# Patient Record
Sex: Female | Born: 1995 | Race: White | Hispanic: No | Marital: Single | Smoking: Never smoker
Health system: Southern US, Community
[De-identification: ages and names within clinical notes are randomized; demographics above are authoritative.]

## PROBLEM LIST (undated history)

## (undated) DIAGNOSIS — J069 Acute upper respiratory infection, unspecified: Secondary | ICD-10-CM

## (undated) HISTORY — DX: Acute upper respiratory infection, unspecified: J06.9

---
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
## 2013-11-11 ENCOUNTER — Other Ambulatory Visit: Payer: Self-pay | Admitting: Family Medicine

## 2013-11-11 ENCOUNTER — Ambulatory Visit
Admission: RE | Admit: 2013-11-11 | Discharge: 2013-11-11 | Disposition: A | Discharge status: Home / Self Care | Payer: PRIVATE HEALTH INSURANCE | Source: Ambulatory Visit | Attending: Family Medicine | Admitting: Family Medicine

## 2013-11-11 DIAGNOSIS — M546 Pain in thoracic spine: Secondary | ICD-10-CM

## 2016-07-25 ENCOUNTER — Other Ambulatory Visit: Payer: Self-pay | Admitting: Physical Medicine and Rehabilitation

## 2016-07-25 ENCOUNTER — Other Ambulatory Visit: Payer: Self-pay | Admitting: Sports Medicine

## 2016-07-25 DIAGNOSIS — M5412 Radiculopathy, cervical region: Secondary | ICD-10-CM

## 2016-07-30 ENCOUNTER — Ambulatory Visit
Admission: RE | Admit: 2016-07-30 | Discharge: 2016-07-30 | Disposition: A | Discharge status: Home / Self Care | Payer: PRIVATE HEALTH INSURANCE | Source: Ambulatory Visit | Attending: Sports Medicine | Admitting: Sports Medicine

## 2016-07-30 DIAGNOSIS — M5412 Radiculopathy, cervical region: Secondary | ICD-10-CM

## 2016-09-22 ENCOUNTER — Ambulatory Visit: Payer: PRIVATE HEALTH INSURANCE | Admitting: Neurology

## 2016-09-23 ENCOUNTER — Telehealth: Payer: Self-pay | Admitting: *Deleted

## 2016-09-23 NOTE — Telephone Encounter (Signed)
Called and spoke to pt. R/s appt from 09/22/16 to 10/11/16 at 8am, check in 730am. I placed pt on cx list as requested. Advised pt to bring insurance card, copay, and updated med list. She verbalized understanding.

## 2016-10-11 ENCOUNTER — Ambulatory Visit: Payer: Self-pay | Admitting: Neurology

## 2019-09-19 DIAGNOSIS — F909 Attention-deficit hyperactivity disorder, unspecified type: Secondary | ICD-10-CM | POA: Diagnosis not present

## 2019-11-22 DIAGNOSIS — R519 Headache, unspecified: Secondary | ICD-10-CM | POA: Diagnosis not present

## 2020-02-04 DIAGNOSIS — L700 Acne vulgaris: Secondary | ICD-10-CM | POA: Diagnosis not present

## 2020-02-25 DIAGNOSIS — H5213 Myopia, bilateral: Secondary | ICD-10-CM | POA: Diagnosis not present

## 2020-05-04 DIAGNOSIS — R300 Dysuria: Secondary | ICD-10-CM | POA: Diagnosis not present

## 2020-09-18 DIAGNOSIS — Z01419 Encounter for gynecological examination (general) (routine) without abnormal findings: Secondary | ICD-10-CM | POA: Diagnosis not present

## 2020-09-18 DIAGNOSIS — Z6824 Body mass index (BMI) 24.0-24.9, adult: Secondary | ICD-10-CM | POA: Diagnosis not present

## 2020-09-18 DIAGNOSIS — Z113 Encounter for screening for infections with a predominantly sexual mode of transmission: Secondary | ICD-10-CM | POA: Diagnosis not present

## 2020-10-06 DIAGNOSIS — F909 Attention-deficit hyperactivity disorder, unspecified type: Secondary | ICD-10-CM | POA: Diagnosis not present

## 2021-01-06 DIAGNOSIS — H6982 Other specified disorders of Eustachian tube, left ear: Secondary | ICD-10-CM | POA: Diagnosis not present

## 2021-03-09 DIAGNOSIS — J069 Acute upper respiratory infection, unspecified: Secondary | ICD-10-CM | POA: Diagnosis not present

## 2021-03-12 DIAGNOSIS — J019 Acute sinusitis, unspecified: Secondary | ICD-10-CM | POA: Diagnosis not present

## 2021-05-18 DIAGNOSIS — M79644 Pain in right finger(s): Secondary | ICD-10-CM | POA: Diagnosis not present

## 2021-09-21 DIAGNOSIS — Z6824 Body mass index (BMI) 24.0-24.9, adult: Secondary | ICD-10-CM | POA: Diagnosis not present

## 2021-09-21 DIAGNOSIS — Z113 Encounter for screening for infections with a predominantly sexual mode of transmission: Secondary | ICD-10-CM | POA: Diagnosis not present

## 2021-09-21 DIAGNOSIS — Z01419 Encounter for gynecological examination (general) (routine) without abnormal findings: Secondary | ICD-10-CM | POA: Diagnosis not present

## 2021-09-21 DIAGNOSIS — F909 Attention-deficit hyperactivity disorder, unspecified type: Secondary | ICD-10-CM | POA: Insufficient documentation

## 2021-10-12 DIAGNOSIS — Z131 Encounter for screening for diabetes mellitus: Secondary | ICD-10-CM | POA: Diagnosis not present

## 2021-10-12 DIAGNOSIS — F909 Attention-deficit hyperactivity disorder, unspecified type: Secondary | ICD-10-CM | POA: Diagnosis not present

## 2021-10-12 DIAGNOSIS — Z13220 Encounter for screening for lipoid disorders: Secondary | ICD-10-CM | POA: Diagnosis not present

## 2022-07-25 DIAGNOSIS — H5213 Myopia, bilateral: Secondary | ICD-10-CM | POA: Diagnosis not present

## 2022-09-22 DIAGNOSIS — Z1151 Encounter for screening for human papillomavirus (HPV): Secondary | ICD-10-CM | POA: Diagnosis not present

## 2022-09-22 DIAGNOSIS — Z6823 Body mass index (BMI) 23.0-23.9, adult: Secondary | ICD-10-CM | POA: Diagnosis not present

## 2022-09-22 DIAGNOSIS — Z124 Encounter for screening for malignant neoplasm of cervix: Secondary | ICD-10-CM | POA: Diagnosis not present

## 2022-09-22 DIAGNOSIS — Z01419 Encounter for gynecological examination (general) (routine) without abnormal findings: Secondary | ICD-10-CM | POA: Diagnosis not present

## 2022-10-12 DIAGNOSIS — F909 Attention-deficit hyperactivity disorder, unspecified type: Secondary | ICD-10-CM | POA: Diagnosis not present

## 2022-10-12 DIAGNOSIS — M7652 Patellar tendinitis, left knee: Secondary | ICD-10-CM | POA: Diagnosis not present

## 2022-11-04 ENCOUNTER — Other Ambulatory Visit (HOSPITAL_COMMUNITY): Payer: Self-pay

## 2022-11-04 MED ORDER — AMPHETAMINE-DEXTROAMPHETAMINE 10 MG PO TABS
20.0000 mg | ORAL_TABLET | Freq: Two times a day (BID) | ORAL | 0 refills | Status: DC
  Filled 2022-11-04 – 2022-11-07 (×2): qty 120, 30d supply, fill #0

## 2022-11-07 ENCOUNTER — Other Ambulatory Visit (HOSPITAL_COMMUNITY): Payer: Self-pay

## 2022-12-05 ENCOUNTER — Other Ambulatory Visit (HOSPITAL_COMMUNITY): Payer: Self-pay

## 2022-12-05 MED ORDER — AMPHETAMINE-DEXTROAMPHETAMINE 10 MG PO TABS
20.0000 mg | ORAL_TABLET | Freq: Two times a day (BID) | ORAL | 0 refills | Status: DC
  Filled 2022-12-05 (×2): qty 120, 30d supply, fill #0

## 2023-01-04 ENCOUNTER — Other Ambulatory Visit (HOSPITAL_COMMUNITY): Payer: Self-pay

## 2023-01-04 MED ORDER — AMPHETAMINE-DEXTROAMPHETAMINE 10 MG PO TABS
20.0000 mg | ORAL_TABLET | Freq: Two times a day (BID) | ORAL | 0 refills | Status: DC
  Filled 2023-01-04: qty 120, 30d supply, fill #0

## 2023-02-03 ENCOUNTER — Other Ambulatory Visit (HOSPITAL_COMMUNITY): Payer: Self-pay

## 2023-02-03 MED ORDER — AMPHETAMINE-DEXTROAMPHETAMINE 10 MG PO TABS
20.0000 mg | ORAL_TABLET | Freq: Two times a day (BID) | ORAL | 0 refills | Status: DC
  Filled 2023-02-03: qty 120, 30d supply, fill #0

## 2023-03-06 ENCOUNTER — Other Ambulatory Visit (HOSPITAL_COMMUNITY): Payer: Self-pay

## 2023-03-06 MED ORDER — AMPHETAMINE-DEXTROAMPHETAMINE 10 MG PO TABS
20.0000 mg | ORAL_TABLET | Freq: Two times a day (BID) | ORAL | 0 refills | Status: DC
  Filled 2023-03-06: qty 120, 30d supply, fill #0

## 2023-04-04 ENCOUNTER — Other Ambulatory Visit (HOSPITAL_COMMUNITY): Payer: Self-pay

## 2023-04-04 MED ORDER — AMPHETAMINE-DEXTROAMPHETAMINE 10 MG PO TABS
20.0000 mg | ORAL_TABLET | Freq: Two times a day (BID) | ORAL | 0 refills | Status: DC
  Filled 2023-04-04: qty 120, 30d supply, fill #0

## 2023-05-02 ENCOUNTER — Other Ambulatory Visit: Payer: Self-pay

## 2023-05-02 ENCOUNTER — Other Ambulatory Visit (HOSPITAL_COMMUNITY): Payer: Self-pay

## 2023-05-02 MED ORDER — AMPHETAMINE-DEXTROAMPHETAMINE 10 MG PO TABS
20.0000 mg | ORAL_TABLET | Freq: Two times a day (BID) | ORAL | 0 refills | Status: DC
  Filled 2023-05-02: qty 120, 30d supply, fill #0

## 2023-05-29 ENCOUNTER — Other Ambulatory Visit (HOSPITAL_COMMUNITY): Payer: Self-pay

## 2023-05-29 MED ORDER — AMPHETAMINE-DEXTROAMPHETAMINE 10 MG PO TABS
20.0000 mg | ORAL_TABLET | Freq: Two times a day (BID) | ORAL | 0 refills | Status: DC
  Filled 2023-05-29: qty 120, 30d supply, fill #0

## 2023-05-30 ENCOUNTER — Other Ambulatory Visit (HOSPITAL_COMMUNITY): Payer: Self-pay

## 2023-06-29 ENCOUNTER — Other Ambulatory Visit (HOSPITAL_COMMUNITY): Payer: Self-pay

## 2023-06-29 MED ORDER — AMPHETAMINE-DEXTROAMPHETAMINE 10 MG PO TABS
20.0000 mg | ORAL_TABLET | Freq: Two times a day (BID) | ORAL | 0 refills | Status: AC
  Filled 2023-06-29: qty 60, 15d supply, fill #0

## 2023-07-24 ENCOUNTER — Other Ambulatory Visit (HOSPITAL_COMMUNITY): Payer: Self-pay

## 2023-07-24 DIAGNOSIS — F909 Attention-deficit hyperactivity disorder, unspecified type: Secondary | ICD-10-CM | POA: Diagnosis not present

## 2023-07-24 DIAGNOSIS — M722 Plantar fascial fibromatosis: Secondary | ICD-10-CM | POA: Diagnosis not present

## 2023-07-24 MED ORDER — AMPHETAMINE-DEXTROAMPHETAMINE 10 MG PO TABS
20.0000 mg | ORAL_TABLET | Freq: Two times a day (BID) | ORAL | 0 refills | Status: AC
  Filled 2023-08-21: qty 120, 30d supply, fill #0

## 2023-07-24 MED ORDER — AMPHETAMINE-DEXTROAMPHETAMINE 10 MG PO TABS
20.0000 mg | ORAL_TABLET | Freq: Two times a day (BID) | ORAL | 0 refills | Status: AC
  Filled 2023-07-24: qty 120, 30d supply, fill #0

## 2023-07-24 MED ORDER — AMPHETAMINE-DEXTROAMPHETAMINE 10 MG PO TABS
20.0000 mg | ORAL_TABLET | Freq: Two times a day (BID) | ORAL | 0 refills | Status: DC
  Filled 2023-09-19: qty 90, 30d supply, fill #0
  Filled 2023-09-19: qty 30, 8d supply, fill #1

## 2023-08-11 ENCOUNTER — Other Ambulatory Visit (HOSPITAL_COMMUNITY): Payer: Self-pay

## 2023-08-11 DIAGNOSIS — J988 Other specified respiratory disorders: Secondary | ICD-10-CM | POA: Diagnosis not present

## 2023-08-11 MED ORDER — IPRATROPIUM BROMIDE 0.03 % NA SOLN
2.0000 | Freq: Two times a day (BID) | NASAL | 1 refills | Status: DC
  Filled 2023-08-11: qty 30, 43d supply, fill #0

## 2023-08-11 MED ORDER — DOXYCYCLINE MONOHYDRATE 100 MG PO CAPS
100.0000 mg | ORAL_CAPSULE | Freq: Two times a day (BID) | ORAL | 0 refills | Status: DC
  Filled 2023-08-11 (×2): qty 20, 10d supply, fill #0

## 2023-08-21 ENCOUNTER — Other Ambulatory Visit (HOSPITAL_COMMUNITY): Payer: Self-pay

## 2023-08-21 ENCOUNTER — Other Ambulatory Visit: Payer: Self-pay

## 2023-09-19 ENCOUNTER — Other Ambulatory Visit (HOSPITAL_COMMUNITY): Payer: Self-pay

## 2023-09-20 ENCOUNTER — Other Ambulatory Visit (HOSPITAL_COMMUNITY): Payer: Self-pay

## 2023-10-11 ENCOUNTER — Other Ambulatory Visit (HOSPITAL_COMMUNITY): Payer: Self-pay

## 2023-10-11 MED ORDER — AMPHETAMINE-DEXTROAMPHETAMINE 10 MG PO TABS
20.0000 mg | ORAL_TABLET | Freq: Two times a day (BID) | ORAL | 0 refills | Status: AC
  Filled 2023-11-14: qty 120, 30d supply, fill #0

## 2023-10-11 MED ORDER — AMPHETAMINE-DEXTROAMPHETAMINE 10 MG PO TABS
20.0000 mg | ORAL_TABLET | Freq: Two times a day (BID) | ORAL | 0 refills | Status: AC
  Filled 2023-12-14: qty 120, 30d supply, fill #0

## 2023-10-11 MED ORDER — AMPHETAMINE-DEXTROAMPHETAMINE 10 MG PO TABS
20.0000 mg | ORAL_TABLET | Freq: Two times a day (BID) | ORAL | 0 refills | Status: AC
  Filled 2023-10-11: qty 120, 60d supply, fill #0
  Filled 2023-10-17: qty 120, 30d supply, fill #0

## 2023-10-17 ENCOUNTER — Other Ambulatory Visit (HOSPITAL_COMMUNITY): Payer: Self-pay

## 2023-11-14 ENCOUNTER — Other Ambulatory Visit (HOSPITAL_COMMUNITY): Payer: Self-pay

## 2023-12-13 ENCOUNTER — Other Ambulatory Visit (HOSPITAL_COMMUNITY): Payer: Self-pay

## 2023-12-14 ENCOUNTER — Other Ambulatory Visit (HOSPITAL_COMMUNITY): Payer: Self-pay

## 2023-12-27 ENCOUNTER — Ambulatory Visit (INDEPENDENT_AMBULATORY_CARE_PROVIDER_SITE_OTHER)

## 2023-12-27 ENCOUNTER — Ambulatory Visit: Admitting: Podiatry

## 2023-12-27 DIAGNOSIS — M21612 Bunion of left foot: Secondary | ICD-10-CM | POA: Diagnosis not present

## 2023-12-27 DIAGNOSIS — M7752 Other enthesopathy of left foot: Secondary | ICD-10-CM

## 2023-12-27 NOTE — Progress Notes (Signed)
  Subjective:  Patient ID: Joann Case, female    DOB: 06/22/1996,   MRN: [id number]  No chief complaint on file.   28 y.o. female presents for concern of left foot bunion that has been present for a while but has started to consistently bother her for the last year and a half. Relates she is pretty active and states the toe is starting to affect her. Relates discomfort around bunion but also relates numbness and balance issues that are related as well. Has tried different shoes but still gets pain  . Denies any other pedal complaints. Denies n/v/f/c.   No past medical history on file.  Objective:  Physical Exam: Vascular: DP/PT pulses 2/4 bilateral. CFT <3 seconds. Normal hair growth on digits. No edema.  Skin. No lacerations or abrasions bilateral feet.  Musculoskeletal: MMT 5/5 bilateral lower extremities in DF, PF, Inversion and Eversion. Deceased ROM in DF of ankle joint.  Neurological: Sensation intact to light touch.   Assessment:   1. Bunion, left foot      Plan:  Patient was evaluated and treated and all questions answered. -Xrays reviewed. 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 Noted elongated on medial border of proximal phalanx and more curvation on lateral side of proximal phalanx.  -Discussed HAV and treatment options;conservative and surgical management; risks, benefits, alternatives discussed. All patient's questions answered. -Discussed padding and wide shoe gear.   -Recommend continue with good supportive shoes and inserts.  -Discussed surgical options. Discussed austin bunionectomy and aiken osteotomy and perioperative course in detail with patient.  Patient is getting married in October and would like to consider surgery after this to help. Did breifly discuss risk and discussed that he numbness and balance issue could be related to pain in her back and may not be resolved with surgery. She expressed  understanding.  -Patient to return to office as needed or sooner if condition worsens.   Jennefer Moats, DPM

## 2024-01-04 ENCOUNTER — Other Ambulatory Visit (HOSPITAL_COMMUNITY): Payer: Self-pay

## 2024-01-04 MED ORDER — AMPHETAMINE-DEXTROAMPHETAMINE 10 MG PO TABS
20.0000 mg | ORAL_TABLET | Freq: Two times a day (BID) | ORAL | 0 refills | Status: DC
  Filled 2024-03-07: qty 120, 30d supply, fill #0

## 2024-01-04 MED ORDER — AMPHETAMINE-DEXTROAMPHETAMINE 10 MG PO TABS
20.0000 mg | ORAL_TABLET | Freq: Two times a day (BID) | ORAL | 0 refills | Status: AC
  Filled 2024-01-11: qty 120, 30d supply, fill #0

## 2024-01-04 MED ORDER — AMPHETAMINE-DEXTROAMPHETAMINE 10 MG PO TABS
20.0000 mg | ORAL_TABLET | Freq: Two times a day (BID) | ORAL | 0 refills | Status: AC
  Filled 2024-02-08: qty 120, 30d supply, fill #0

## 2024-01-11 ENCOUNTER — Other Ambulatory Visit (HOSPITAL_COMMUNITY): Payer: Self-pay

## 2024-02-08 ENCOUNTER — Other Ambulatory Visit (HOSPITAL_COMMUNITY): Payer: Self-pay

## 2024-02-09 ENCOUNTER — Other Ambulatory Visit (HOSPITAL_COMMUNITY): Payer: Self-pay

## 2024-02-12 NOTE — Progress Notes (Unsigned)
 New Patient Note  RE: Joann Case MRN: [id number] DOB: [birth year] Date of Office Visit: 02/13/2024  Consult requested by: Elida Grounds, DO Primary care provider: No primary care provider on file.  Chief Complaint: No chief complaint on file.  History of Present Illness: I had the pleasure of seeing Joann Case for initial evaluation at the [facility] on 02/12/2024. She is a 28 y.o. female, who is referred here by No primary care provider on file. for the evaluation of ***.  Discussed the use of AI scribe software for clinical note transcription with the patient, who gave verbal consent to proceed.  History of Present Illness             ***  Assessment and Plan: Joann Case is a 28 y.o. female with: ***  Assessment and Plan               No follow-ups on file.  No orders of the defined types were placed in this encounter.  Lab Orders  No laboratory test(s) ordered today    Other allergy screening: Asthma: {Blank single:19197::"yes","no"} Rhino conjunctivitis: {Blank single:19197::"yes","no"} Food allergy: {Blank single:19197::"yes","no"} Medication allergy: {Blank single:19197::"yes","no"} Hymenoptera allergy: {Blank single:19197::"yes","no"} Urticaria: {Blank single:19197::"yes","no"} Eczema:{Blank single:19197::"yes","no"} History of recurrent infections suggestive of immunodeficency: {Blank single:19197::"yes","no"}  Diagnostics: Spirometry:  Tracings reviewed. Her effort: {Blank single:19197::"Good reproducible efforts.","It was hard to get consistent efforts and there is a question as to whether this reflects a maximal maneuver.","Poor effort, data can not be interpreted."} FVC: ***L FEV1: ***L, ***% predicted FEV1/FVC ratio: ***% Interpretation: {Blank single:19197::"Spirometry consistent with mild obstructive disease","Spirometry consistent with moderate obstructive disease","Spirometry consistent with severe obstructive  disease","Spirometry consistent with possible restrictive disease","Spirometry consistent with mixed obstructive and restrictive disease","Spirometry uninterpretable due to technique","Spirometry consistent with normal pattern","No overt abnormalities noted given today's efforts"}.  Please see scanned spirometry results for details.  Skin Testing: {Blank single:19197::"Select foods","Environmental allergy panel","Environmental allergy panel and select foods","Food allergy panel","None","Deferred due to recent antihistamines use"}. *** Results discussed with patient/family.   Past Medical History: There are no active problems to display for this patient.  No past medical history on file. Past Surgical History: *** The histories are not reviewed yet. Please review them in the "History" navigator section and refresh this SmartLink. Medication List:  Current Outpatient Medications  Medication Sig Dispense Refill  . amphetamine -dextroamphetamine  (ADDERALL) 10 MG tablet Take 2 tablets (20 mg total) by mouth 2 (two) times daily. 60 tablet 0  . amphetamine -dextroamphetamine  (ADDERALL) 10 MG tablet Take 2 tablets (20 mg total) by mouth 2 (two) times daily. (08/21/23) 120 tablet 0  . amphetamine -dextroamphetamine  (ADDERALL) 10 MG tablet Take 2 tablets (20 mg total) by mouth 2 (two) times daily. 120 tablet 0  . amphetamine -dextroamphetamine  (ADDERALL) 10 MG tablet Take 2 tablets (20 mg total) by mouth 2 (two) times daily. 120 tablet 0  . amphetamine -dextroamphetamine  (ADDERALL) 10 MG tablet Take 2 tablets (20 mg total) by mouth 2 (two) times daily. (12/05/23) 120 tablet 0  . amphetamine -dextroamphetamine  (ADDERALL) 10 MG tablet Take 2 tablets (20 mg total) by mouth 2 (two) times daily. (11/06/23) 120 tablet 0  . amphetamine -dextroamphetamine  (ADDERALL) 10 MG tablet Take 2 tablets (20 mg total) by mouth 2 (two) times daily. 120 tablet 0  . [START ON 03/07/2024] amphetamine -dextroamphetamine  (ADDERALL) 10 MG  tablet Take 2 tablets (20 mg total) by mouth 2 (two) times daily. 120 tablet 0  . amphetamine -dextroamphetamine  (ADDERALL) 10 MG tablet Take 2 tablets (20 mg total) by mouth  2 (two) times daily. 120 tablet 0  . doxycycline  (MONODOX ) 100 MG capsule Take 1 capsule (100 mg total) by mouth 2 (two) times daily for 10 days. 20 capsule 0  . ipratropium (ATROVENT ) 0.03 % nasal spray Place 2 sprays into each nostril 2 (two) times daily. 30 mL 1   No current facility-administered medications for this visit.   Allergies: No Known Allergies Social History: Social History   Socioeconomic History  . Marital status: Single    Spouse name: Not on file  . Number of children: Not on file  . Years of education: Not on file  . Highest education level: Not on file  Occupational History  . Not on file  Tobacco Use  . Smoking status: Not on file  . Smokeless tobacco: Not on file  Substance and Sexual Activity  . Alcohol use: Not on file  . Drug use: Not on file  . Sexual activity: Not on file  Other Topics Concern  . Not on file  Social History Narrative  . Not on file   Social Drivers of Health   Financial Resource Strain: Not on file  Food Insecurity: Not on file  Transportation Needs: Not on file  Physical Activity: Not on file  Stress: Not on file  Social Connections: Not on file   Lives in a ***. Smoking: *** Occupation: ***  Environmental HistorySurveyor, minerals in the house: Copywriter, advertising in the family room: {Blank single:19197::"yes","no"} Carpet in the bedroom: {Blank single:19197::"yes","no"} Heating: {Blank single:19197::"electric","gas","heat pump"} Cooling: {Blank single:19197::"central","window","heat pump"} Pet: {Blank single:19197::"yes ***","no"}  Family History: No family history on file. Problem                               Relation Asthma                                   *** Eczema                                *** Food allergy                           *** Allergic rhino conjunctivitis     ***  Review of Systems  Constitutional:  Negative for appetite change, chills, fever and unexpected weight change.  HENT:  Negative for congestion and rhinorrhea.   Eyes:  Negative for itching.  Respiratory:  Negative for cough, chest tightness, shortness of breath and wheezing.   Cardiovascular:  Negative for chest pain.  Gastrointestinal:  Negative for abdominal pain.  Genitourinary:  Negative for difficulty urinating.  Skin:  Negative for rash.  Neurological:  Negative for headaches.   Objective: There were no vitals taken for this visit. There is no height or weight on file to calculate BMI. Physical Exam Vitals and nursing note reviewed.  Constitutional:      Appearance: Normal appearance. She is well-developed.  HENT:     Head: Normocephalic and atraumatic.     Right Ear: Tympanic membrane and external ear normal.     Left Ear: Tympanic membrane and external ear normal.     Nose: Nose normal.     Mouth/Throat:     Mouth: Mucous membranes are moist.     Pharynx: Oropharynx is clear.  Eyes:     Conjunctiva/sclera: Conjunctivae normal.  Cardiovascular:     Rate and Rhythm: Normal rate and regular rhythm.     Heart sounds: Normal heart sounds. No murmur heard.    No friction rub. No gallop.  Pulmonary:     Effort: Pulmonary effort is normal.     Breath sounds: Normal breath sounds. No wheezing, rhonchi or rales.  Musculoskeletal:     Cervical back: Neck supple.  Skin:    General: Skin is warm.     Findings: No rash.  Neurological:     Mental Status: She is alert and oriented to person, place, and time.  Psychiatric:        Behavior: Behavior normal.  The plan was reviewed with the patient/family, and all questions/concerned were addressed.  It was my pleasure to see Joann Case today and participate in her care. Please feel free to contact me with any questions or concerns.  Sincerely,  Eudelia Hero,  DO Allergy & Immunology  [facility] office: [phone] [facility] office: [phone]

## 2024-02-13 ENCOUNTER — Encounter: Payer: Self-pay | Admitting: Allergy

## 2024-02-13 ENCOUNTER — Other Ambulatory Visit: Payer: Self-pay

## 2024-02-13 ENCOUNTER — Ambulatory Visit: Admitting: Allergy

## 2024-02-13 VITALS — BP 118/70 | HR 77 | Temp 98.6°F | Resp 18 | Ht 63.39 in | Wt 137.8 lb

## 2024-02-13 DIAGNOSIS — L2389 Allergic contact dermatitis due to other agents: Secondary | ICD-10-CM

## 2024-02-13 DIAGNOSIS — J3089 Other allergic rhinitis: Secondary | ICD-10-CM | POA: Diagnosis not present

## 2024-02-13 DIAGNOSIS — R12 Heartburn: Secondary | ICD-10-CM

## 2024-02-13 DIAGNOSIS — H1013 Acute atopic conjunctivitis, bilateral: Secondary | ICD-10-CM

## 2024-02-13 DIAGNOSIS — Z8709 Personal history of other diseases of the respiratory system: Secondary | ICD-10-CM

## 2024-02-13 NOTE — Patient Instructions (Addendum)
 Rhinitis  Return for allergy skin testing. Will make additional recommendations based on results. If significant positives will recommend allergy injections - handout given. Make sure you don't take any antihistamines for 3 days before the skin testing appointment. Don't put any lotion on the back and arms on the day of testing.  Plan on being here for 60 minutes.   Use over the counter antihistamines such as Zyrtec (cetirizine), Claritin (loratadine), Allegra (fexofenadine), or Xyzal (levocetirizine) daily as needed. May take twice a day during allergy flares. May switch antihistamines every few months.   Bee stings I usually order bloodwork for this but with no history of stings/reactions it will most likely be negative. See handout.  Heartburn See handout for lifestyle and dietary modifications. May take famotidine (pepcid) once a day as before.  Hold 1 day before skin testing.  Rash Keep track of rashes and take pictures. See below for proper skin care. Use fragrance free and dye free products. No dryer sheets or fabric softener.   If you develop other contact rash issues then will recommend patch testing next.   Follow up for skin testing.

## 2024-03-04 NOTE — Progress Notes (Unsigned)
 Skin testing note  RE: Joann Case MRN: [id number] DOB: 12-25-1995 Date of Office Visit: 03/05/2024  Referring provider: No ref. provider found Primary care provider: Auston Opal, DO  Chief Complaint: skin testing  History of Present Illness: I had the pleasure of seeing Joann Case for a skin testing visit at the [facility] on 03/05/2024. She is a 28 y.o. female, who is being followed for allergic rhinoconjunctivitis, contact dermatitis, history of asthma and heartburn. Her previous allergy office visit was on 02/13/2024 with Dr. Luke. Today is a skin testing visit.   Discussed the use of AI scribe software for clinical note transcription with the patient, who gave verbal consent to proceed.  Assessment and Plan: Joann Case is a 28 y.o. female with: Other allergic rhinitis Allergic conjunctivitis of both eyes Past history - chronic environmental allergies with symptoms exacerbated in spring. Claritin provides partial relief. Today's skin testing positive to ragweed, trees, mold, dog.  Start environmental control measures as below. Use over the counter antihistamines such as Zyrtec (cetirizine), Claritin (loratadine), Allegra (fexofenadine), or Xyzal (levocetirizine) daily as needed. May take twice a day during allergy flares. May switch antihistamines every few months. Use Flonase (fluticasone) nasal spray 1-2 sprays per nostril once a day as needed for nasal congestion.  Nasal saline spray (i.e., Simply Saline) or nasal saline lavage (i.e., NeilMed) is recommended as needed and prior to medicated nasal sprays. Use olopatadine eye drops 0.7% once a day as needed for itchy/watery eyes. Sample given.  Take contacts out and wait 5-10 min after using eye drops to put contacts back in.  Recommend allergy injections. 2 injections. Let us  know when ready to start.  Had a detailed discussion with patient/family that clinical history is suggestive of allergic rhinitis,  and may benefit from allergy immunotherapy (AIT). Discussed in detail regarding the dosing, schedule, side effects (mild to moderate local allergic reaction and rarely systemic allergic reactions including anaphylaxis), and benefits (significant improvement in nasal symptoms, seasonal flares of asthma) of immunotherapy with the patient. There is significant time commitment involved with allergy shots, which includes weekly immunotherapy injections for first 9-12 months and then biweekly to monthly injections for 3-5 years.   Allergic contact dermatitis due to other agents Past history - perioral dermatitis triggered by certain sunscreens. Currently using Cerave sunscreen without issues. Keep track of rashes and take pictures. See below for proper skin care. Use fragrance free and dye free products. No dryer sheets or fabric softener.   If you develop other contact rash issues then will recommend patch testing next.      Heartburn Continue lifestyle and dietary modifications. May take famotidine (pepcid) once a day as before.   Return in about 1 year (around 03/05/2025).  No orders of the defined types were placed in this encounter.  Lab Orders  No laboratory test(s) ordered today    Diagnostics: Skin Testing: Environmental allergy panel. Today's skin testing positive to ragweed, trees, mold, dog.  Results discussed with patient/family.  Airborne Adult Perc - 03/05/24 1354     Time Antigen Placed 0200    Allergen Manufacturer Jestine    Location Back    Number of Test 55    1. Control-Buffer 50% Glycerol Negative    2. Control-Histamine 3+    3. Bahia Negative    4. French Southern Territories Negative    5. Johnson Negative    6. [place]  Blue Negative    7. Meadow Fescue Negative    8.  Perennial Rye Negative    9. Timothy Negative    10. Ragweed Mix Negative    11. Cocklebur Negative    12. Plantain,  English Negative    13. Baccharis Negative    14. Dog Fennel Negative    15. Russian Thistle  Negative    16. Lamb's Quarters Negative    17. Sheep Sorrell Negative    18. Rough Pigweed Negative    19. Marsh Elder, Rough Negative    20. Mugwort, Common Negative    21. Box, Elder 2+    22. Cedar, red Negative    23. Sweet Gum Negative    24. Pecan Pollen Negative    25. Pine Mix Negative    26. Walnut, Black Pollen Negative    27. Red Mulberry Negative    28. Ash Mix Negative    29. Birch Mix Negative    30. Beech American Negative    31. Cottonwood, Guinea-Bissau Negative    32. Hickory, White Negative    33. Maple Mix 2+    34. Oak, Guinea-Bissau Mix Negative    35. Sycamore Eastern Negative    36. Alternaria Alternata 2+    37. Cladosporium Herbarum Negative    38. Aspergillus Mix 2+    39. Penicillium Mix Negative    40. Bipolaris Sorokiniana (Helminthosporium) --   +/-   41. Drechslera Spicifera (Curvularia) 2+    42. Mucor Plumbeus Negative    43. Fusarium Moniliforme 2+    44. Aureobasidium Pullulans (pullulara) Negative    45. Rhizopus Oryzae Negative    46. Botrytis Cinera Negative    47. Epicoccum Nigrum Negative    48. Phoma Betae 3+    49. Dust Mite Mix Negative    50. Cat Hair 10,000 BAU/ml Negative    51.  Dog Epithelia Negative    52. Mixed Feathers Negative    53. Horse Epithelia Negative    54. Cockroach, German Negative    55. Tobacco Leaf Negative          Intradermal - 03/05/24 1442     Time Antigen Placed 0240    Number of Test 11    Control 3+    Bahia Negative    French Southern Territories Negative    Johnson Negative    7 Grass Negative    Ragweed Mix 2+    Weed Mix Negative    Mite Mix Negative    Cat Negative    Dog 2+    Cockroach Negative          Previous notes and tests were reviewed. The plan was reviewed with the patient/family, and all questions/concerned were addressed.  It was my pleasure to see Joann Case today and participate in her care. Please feel free to contact me with any questions or concerns.  Sincerely,  Orlan Cramp, DO Allergy  & Immunology  [facility]  [facility] office: [phone] [facility] office: [phone]

## 2024-03-05 ENCOUNTER — Encounter: Payer: Self-pay | Admitting: Allergy

## 2024-03-05 ENCOUNTER — Ambulatory Visit: Admitting: Allergy

## 2024-03-05 DIAGNOSIS — J3089 Other allergic rhinitis: Secondary | ICD-10-CM | POA: Diagnosis not present

## 2024-03-05 DIAGNOSIS — L2389 Allergic contact dermatitis due to other agents: Secondary | ICD-10-CM

## 2024-03-05 DIAGNOSIS — H1013 Acute atopic conjunctivitis, bilateral: Secondary | ICD-10-CM

## 2024-03-05 DIAGNOSIS — Z8709 Personal history of other diseases of the respiratory system: Secondary | ICD-10-CM

## 2024-03-05 DIAGNOSIS — R12 Heartburn: Secondary | ICD-10-CM

## 2024-03-05 NOTE — Patient Instructions (Addendum)
 Today's skin testing positive to ragweed, trees, mold, dog.   Results given.  Environmental allergies Start environmental control measures as below. Use over the counter antihistamines such as Zyrtec (cetirizine), Claritin (loratadine), Allegra (fexofenadine), or Xyzal (levocetirizine) daily as needed. May take twice a day during allergy flares. May switch antihistamines every few months. Use Flonase (fluticasone) nasal spray 1-2 sprays per nostril once a day as needed for nasal congestion.  Nasal saline spray (i.e., Simply Saline) or nasal saline lavage (i.e., NeilMed) is recommended as needed and prior to medicated nasal sprays. Use olopatadine eye drops 0.7% once a day as needed for itchy/watery eyes. Sample given.  Take contacts out and wait 5-10 min after using eye drops to put contacts back in.  Recommend allergy injections. 2 injections. Let us  know when ready to start.  Had a detailed discussion with patient/family that clinical history is suggestive of allergic rhinitis, and may benefit from allergy immunotherapy (AIT). Discussed in detail regarding the dosing, schedule, side effects (mild to moderate local allergic reaction and rarely systemic allergic reactions including anaphylaxis), and benefits (significant improvement in nasal symptoms, seasonal flares of asthma) of immunotherapy with the patient. There is significant time commitment involved with allergy shots, which includes weekly immunotherapy injections for first 9-12 months and then biweekly to monthly injections for 3-5 years.  Heartburn Continue lifestyle and dietary modifications. May take famotidine (pepcid) once a day as before.   Rash Keep track of rashes and take pictures. See below for proper skin care. Use fragrance free and dye free products. No dryer sheets or fabric softener.   If you develop other contact rash issues then will recommend patch testing next.   Return in about 1 year (around 03/05/2025). Or  sooner if needed.   Reducing Pollen Exposure Pollen seasons: trees (spring), grass (summer) and ragweed/weeds (fall). Keep windows closed in your home and car to lower pollen exposure.  Install air conditioning in the bedroom and throughout the house if possible.  Avoid going out in dry windy days - especially early morning. Pollen counts are highest between 5 - 10 AM and on dry, hot and windy days.  Save outside activities for late afternoon or after a heavy rain, when pollen levels are lower.  Avoid mowing of grass if you have grass pollen allergy. Be aware that pollen can also be transported indoors on people and pets.  Dry your clothes in an automatic dryer rather than hanging them outside where they might collect pollen.  Rinse hair and eyes before bedtime.  Mold Control Mold and fungi can grow on a variety of surfaces provided certain temperature and moisture conditions exist.  Outdoor molds grow on plants, decaying vegetation and soil. The major outdoor mold, Alternaria and Cladosporium, are found in very high numbers during hot and dry conditions. Generally, a late summer - fall peak is seen for common outdoor fungal spores. Rain will temporarily lower outdoor mold spore count, but counts rise rapidly when the rainy period ends. The most important indoor molds are Aspergillus and Penicillium. Dark, humid and poorly ventilated basements are ideal sites for mold growth. The next most common sites of mold growth are the bathroom and the kitchen. Outdoor (Seasonal) Mold Control Use air conditioning and keep windows closed. Avoid exposure to decaying vegetation. Avoid leaf raking. Avoid grain handling. Consider wearing a face mask if working in moldy areas.  Indoor (Perennial) Mold Control  Maintain humidity below 50%. Get rid of mold growth on hard surfaces with water,  detergent and, if necessary, 5% bleach (do not mix with other cleaners). Then dry the area completely. If mold covers  an area more than 10 square feet, consider hiring an indoor environmental professional. For clothing, washing with soap and water is best. If moldy items cannot be cleaned and dried, throw them away. Remove sources e.g. contaminated carpets. Repair and seal leaking roofs or pipes. Using dehumidifiers in damp basements may be helpful, but empty the water and clean units regularly to prevent mildew from forming. All rooms, especially basements, bathrooms and kitchens, require ventilation and cleaning to deter mold and mildew growth. Avoid carpeting on concrete or damp floors, and storing items in damp areas.  Pet Allergen Avoidance: Contrary to popular opinion, there are no "hypoallergenic" breeds of dogs or cats. That is because people are not allergic to an animal's hair, but to an allergen found in the animal's saliva, dander (dead skin flakes) or urine. Pet allergy symptoms typically occur within minutes. For some people, symptoms can build up and become most severe 8 to 12 hours after contact with the animal. People with severe allergies can experience reactions in public places if dander has been transported on the pet owners' clothing. Keeping an animal outdoors is only a partial solution, since homes with pets in the yard still have higher concentrations of animal allergens. Before getting a pet, ask your allergist to determine if you are allergic to animals. If your pet is already considered part of your family, try to minimize contact and keep the pet out of the bedroom and other rooms where you spend a great deal of time. As with dust mites, vacuum carpets often or replace carpet with a hardwood floor, tile or linoleum. High-efficiency particulate air (HEPA) cleaners can reduce allergen levels over time. While dander and saliva are the source of cat and dog allergens, urine is the source of allergens from rabbits, hamsters, mice and [place] pigs; so ask a non-allergic family member to clean the  animal's cage. If you have a pet allergy, talk to your allergist about the potential for allergy immunotherapy (allergy shots). This strategy can often provide long-term relief.  Skin care recommendations  Bath time: Always use lukewarm water. AVOID very hot or cold water. Keep bathing time to 5-10 minutes. Do NOT use bubble bath. Use a mild soap and use just enough to wash the dirty areas. Do NOT scrub skin vigorously.  After bathing, pat dry your skin with a towel. Do NOT rub or scrub the skin.  Moisturizers and prescriptions:  ALWAYS apply moisturizers immediately after bathing (within 3 minutes). This helps to lock-in moisture. Use the moisturizer several times a day over the whole body. Good summer moisturizers include: Aveeno, CeraVe, Cetaphil. Good winter moisturizers include: Aquaphor, Vaseline, Cerave, Cetaphil, Eucerin, Vanicream. When using moisturizers along with medications, the moisturizer should be applied about one hour after applying the medication to prevent diluting effect of the medication or moisturize around where you applied the medications. When not using medications, the moisturizer can be continued twice daily as maintenance.  Laundry and clothing: Avoid laundry products with added color or perfumes. Use unscented hypo-allergenic laundry products such as Tide free, Cheer free & gentle, and All free and clear.  If the skin still seems dry or sensitive, you can try double-rinsing the clothes. Avoid tight or scratchy clothing such as wool. Do not use fabric softeners or dyer sheets.

## 2024-03-06 ENCOUNTER — Other Ambulatory Visit (HOSPITAL_COMMUNITY): Payer: Self-pay

## 2024-03-07 ENCOUNTER — Other Ambulatory Visit (HOSPITAL_COMMUNITY): Payer: Self-pay

## 2024-04-05 ENCOUNTER — Other Ambulatory Visit (HOSPITAL_COMMUNITY): Payer: Self-pay

## 2024-04-05 MED ORDER — AMPHETAMINE-DEXTROAMPHETAMINE 10 MG PO TABS
20.0000 mg | ORAL_TABLET | Freq: Two times a day (BID) | ORAL | 0 refills | Status: AC
  Filled 2024-04-05: qty 30, 8d supply, fill #0
  Filled 2024-04-05: qty 90, 22d supply, fill #0

## 2024-04-05 MED ORDER — AMPHETAMINE-DEXTROAMPHETAMINE 10 MG PO TABS
20.0000 mg | ORAL_TABLET | Freq: Two times a day (BID) | ORAL | 0 refills | Status: AC
  Filled 2024-05-07: qty 120, 30d supply, fill #0

## 2024-04-05 MED ORDER — AMPHETAMINE-DEXTROAMPHETAMINE 10 MG PO TABS
20.0000 mg | ORAL_TABLET | Freq: Two times a day (BID) | ORAL | 0 refills | Status: DC
  Filled 2024-06-05: qty 120, 30d supply, fill #0

## 2024-04-08 ENCOUNTER — Other Ambulatory Visit: Payer: Self-pay | Admitting: Family Medicine

## 2024-04-08 DIAGNOSIS — M5412 Radiculopathy, cervical region: Secondary | ICD-10-CM

## 2024-04-22 ENCOUNTER — Ambulatory Visit
Admission: RE | Admit: 2024-04-22 | Discharge: 2024-04-22 | Disposition: A | Discharge status: Home / Self Care | Payer: PRIVATE HEALTH INSURANCE | Source: Ambulatory Visit | Attending: Family Medicine | Admitting: Family Medicine

## 2024-04-22 DIAGNOSIS — M5412 Radiculopathy, cervical region: Secondary | ICD-10-CM

## 2024-05-07 ENCOUNTER — Other Ambulatory Visit (HOSPITAL_COMMUNITY): Payer: Self-pay

## 2024-05-16 NOTE — Progress Notes (Unsigned)
 Joann Case D.[facility]MENTE[name] [address] [place] [address] Phone: [phone]   Assessment and Plan:     1. Neck pain (Primary) 2. Chronic left-sided thoracic back pain 3. Cervical disc herniation 4. Strain of left trapezius muscle, initial encounter 5. Anxiety state -Chronic with exacerbation, initial sports medicine visit - Left-sided neck, trapezius, upper back pain radiating around to anterior rib cage and pectoralis musculature.  Pain has been present for 8+ years, but worsening over the past several months.  Likely multifactorial including mild disc herniations at C4-C5 and C6/C7 as seen on cervical spine MRI, anxiety/stress related changes from work/wedding planning/building house, musculoskeletal dysfunction - Start meloxicam  15 mg daily x2 weeks.  If still having pain after 2 weeks, complete 3rd-week of NSAID. May use remaining NSAID as needed once daily for pain control.  Do not to use additional over-the-counter NSAIDs (ibuprofen, naproxen, Advil, Aleve, etc.) while taking prescription NSAIDs.  May use Tylenol [phone] mg 2 to 3 times a day for breakthrough pain. -Start Cymbalta  30 mg daily - Start HEP for neck - Recommend epidural CSI to left-sided C6-C7 - Patient has trialed stretching, chiropractic treatments in the past with no significant long-term relief  15 additional minutes spent for educating Therapeutic Home Exercise Program.  This included exercises focusing on stretching, strengthening, with focus on eccentric aspects.   Long term goals include an improvement in range of motion, strength, endurance as well as avoiding reinjury. Patient's frequency would include in 1-2 times a day, 3-5 times a week for a duration of 6-12 weeks. Proper technique shown and discussed handout in great detail with ATC.  All questions were discussed and answered.    Pertinent previous records reviewed include C-spine MRI 04/22/2024   Follow Up: 2  weeks after epidural to review benefits.  Could consider additional epidural versus Cymbalta  adjustment   Subjective:   I, Joann Case, am serving as a Neurosurgeon for Doctor Morene Mace  Chief Complaint: neck and upper trap pain   HPI:   05/17/2024  Patient is a 28 year old female with neck and upper trap pain. Patient states intermittent pain 8 years. Pain starts in the neck and radiates down the upper trap. Does have MRI. Has done PT. Massages dont help. Pain is getting worse. Decreased ROM. Doe shave anxiety about flares. No MOI. Pain feels like a stabbing shooting pain.    Relevant Historical Information: ADHD  Additional pertinent review of systems negative.   Current Outpatient Medications:    DULoxetine  (CYMBALTA ) 30 MG capsule, Take 1 capsule (30 mg total) by mouth daily., Disp: 30 capsule, Rfl: 0   meloxicam  (MOBIC ) 15 MG tablet, Take 1 tablet (15 mg total) by mouth daily., Disp: 30 tablet, Rfl: 0   amphetamine -dextroamphetamine  (ADDERALL) 10 MG tablet, Take 2 tablets (20 mg total) by mouth 2 (two) times daily., Disp: 60 tablet, Rfl: 0   amphetamine -dextroamphetamine  (ADDERALL) 10 MG tablet, Take 2 tablets (20 mg total) by mouth 2 (two) times daily. (08/21/23), Disp: 120 tablet, Rfl: 0   amphetamine -dextroamphetamine  (ADDERALL) 10 MG tablet, Take 2 tablets (20 mg total) by mouth 2 (two) times daily., Disp: 120 tablet, Rfl: 0   amphetamine -dextroamphetamine  (ADDERALL) 10 MG tablet, Take 2 tablets (20 mg total) by mouth 2 (two) times daily., Disp: 120 tablet, Rfl: 0   amphetamine -dextroamphetamine  (ADDERALL) 10 MG tablet, Take 2 tablets (20 mg total) by mouth 2 (two) times daily. (12/05/23), Disp: 120 tablet, Rfl: 0   amphetamine -dextroamphetamine  (  ADDERALL) 10 MG tablet, Take 2 tablets (20 mg total) by mouth 2 (two) times daily. (11/06/23), Disp: 120 tablet, Rfl: 0   amphetamine -dextroamphetamine  (ADDERALL) 10 MG tablet, Take 2 tablets (20 mg total) by mouth 2 (two) times daily.,  Disp: 120 tablet, Rfl: 0   amphetamine -dextroamphetamine  (ADDERALL) 10 MG tablet, Take 2 tablets (20 mg total) by mouth 2 (two) times daily., Disp: 120 tablet, Rfl: 0   amphetamine -dextroamphetamine  (ADDERALL) 10 MG tablet, Take 2 tablets (20 mg total) by mouth 2 (two) times daily., Disp: 120 tablet, Rfl: 0   amphetamine -dextroamphetamine  (ADDERALL) 10 MG tablet, Take 2 tablets (20 mg total) by mouth 2 (two) times daily., Disp: 120 tablet, Rfl: 0   amphetamine -dextroamphetamine  (ADDERALL) 10 MG tablet, Take 2 tablets (20 mg total) by mouth 2 (two) times daily., Disp: 120 tablet, Rfl: 0   TRI-SPRINTEC 0.18/0.215/0.25 MG-35 MCG tablet, Take 1 tablet by mouth daily., Disp: , Rfl:    Objective:     Vitals:   05/17/24 1046  Pulse: 100  SpO2: 100%  Weight: 139 lb (63 kg)  Height: 5' 3 (1.6 m)      Body mass index is 24.62 kg/m.    Physical Exam:    Neck Exam: Cervical Spine- Posture normal Skin- normal, intact  Neuro:  Strength-  Right Left   Deltoid (C5) 5/5 5/5  Bicep/Brachioradialis (C5/6) 5/5  5/5  Wrist Extension (C6) 5/5 5/5  Tricep (C7) 5/5 5/5  Wrist Flexion (C7) 5/5 5/5  Grip (C8) 5/5 4+/5  Finger Abduction (T1) 5/5 5/5   Sensation: Describes decrease sensation in entirety of left upper extremity compared to right.    Spurling's:  negative bilaterally Neck ROM: Full active ROM with left-sided neck tension with extension, rotation, sidebending  TTP: cervical spinous processes, cervical paraspinal, thoracic paraspinal, trapezius all worse on left   Electronically signed by:  Odis Mace D.[name] 11:18 AM 05/17/24

## 2024-05-17 ENCOUNTER — Ambulatory Visit: Admitting: Sports Medicine

## 2024-05-17 ENCOUNTER — Other Ambulatory Visit (HOSPITAL_COMMUNITY): Payer: Self-pay

## 2024-05-17 VITALS — HR 100 | Ht 63.0 in | Wt 139.0 lb

## 2024-05-17 DIAGNOSIS — F411 Generalized anxiety disorder: Secondary | ICD-10-CM

## 2024-05-17 DIAGNOSIS — M5020 Other cervical disc displacement, unspecified cervical region: Secondary | ICD-10-CM

## 2024-05-17 DIAGNOSIS — G8929 Other chronic pain: Secondary | ICD-10-CM

## 2024-05-17 DIAGNOSIS — M542 Cervicalgia: Secondary | ICD-10-CM

## 2024-05-17 DIAGNOSIS — M546 Pain in thoracic spine: Secondary | ICD-10-CM | POA: Diagnosis not present

## 2024-05-17 DIAGNOSIS — S46812A Strain of other muscles, fascia and tendons at shoulder and upper arm level, left arm, initial encounter: Secondary | ICD-10-CM | POA: Diagnosis not present

## 2024-05-17 MED ORDER — DULOXETINE HCL 30 MG PO CPEP
30.0000 mg | ORAL_CAPSULE | Freq: Every day | ORAL | 0 refills | Status: DC
  Filled 2024-05-17: qty 30, 30d supply, fill #0

## 2024-05-17 MED ORDER — MELOXICAM 15 MG PO TABS
15.0000 mg | ORAL_TABLET | Freq: Every day | ORAL | 0 refills | Status: DC
  Filled 2024-05-17: qty 30, 30d supply, fill #0

## 2024-05-17 NOTE — Patient Instructions (Addendum)
-   Start meloxicam  15 mg daily x2 weeks.  If still having pain after 2 weeks, complete 3rd-week of NSAID. May use remaining NSAID as needed once daily for pain control.  Do not to use additional over-the-counter NSAIDs (ibuprofen, naproxen, Advil, Aleve, etc.) while taking prescription NSAIDs.  May use Tylenol [phone] mg 2 to 3 times a day for breakthrough pain.  Cymbalta  30 mg daily   Neck and trap HEP   Left C6-7  Follow up 2 weeks after epidural to discuss results

## 2024-05-27 NOTE — Discharge Instructions (Signed)

## 2024-05-28 ENCOUNTER — Ambulatory Visit
Admission: RE | Admit: 2024-05-28 | Discharge: 2024-05-28 | Disposition: A | Discharge status: Home / Self Care | Source: Ambulatory Visit | Attending: Sports Medicine | Admitting: Sports Medicine

## 2024-05-28 DIAGNOSIS — S46812A Strain of other muscles, fascia and tendons at shoulder and upper arm level, left arm, initial encounter: Secondary | ICD-10-CM

## 2024-05-28 DIAGNOSIS — G8929 Other chronic pain: Secondary | ICD-10-CM

## 2024-05-28 DIAGNOSIS — M542 Cervicalgia: Secondary | ICD-10-CM

## 2024-05-28 DIAGNOSIS — M5020 Other cervical disc displacement, unspecified cervical region: Secondary | ICD-10-CM

## 2024-05-28 DIAGNOSIS — F411 Generalized anxiety disorder: Secondary | ICD-10-CM

## 2024-05-28 MED ORDER — IOPAMIDOL (ISOVUE-M 300) INJECTION 61%
1.0000 mL | Freq: Once | INTRAMUSCULAR | Status: AC | PRN
  Administered 2024-05-28: 1 mL via EPIDURAL

## 2024-05-28 MED ORDER — TRIAMCINOLONE ACETONIDE 40 MG/ML IJ SUSP (RADIOLOGY)
60.0000 mg | Freq: Once | INTRAMUSCULAR | Status: AC
  Administered 2024-05-28: 60 mg via EPIDURAL

## 2024-06-05 ENCOUNTER — Other Ambulatory Visit (HOSPITAL_COMMUNITY): Payer: Self-pay

## 2024-06-05 ENCOUNTER — Other Ambulatory Visit: Payer: Self-pay

## 2024-06-17 ENCOUNTER — Ambulatory Visit: Admitting: Sports Medicine

## 2024-06-17 ENCOUNTER — Other Ambulatory Visit (HOSPITAL_COMMUNITY): Payer: Self-pay

## 2024-06-17 VITALS — BP 110/76 | HR 88 | Ht 63.0 in | Wt 136.0 lb

## 2024-06-17 DIAGNOSIS — G8929 Other chronic pain: Secondary | ICD-10-CM | POA: Diagnosis not present

## 2024-06-17 DIAGNOSIS — M546 Pain in thoracic spine: Secondary | ICD-10-CM | POA: Diagnosis not present

## 2024-06-17 DIAGNOSIS — M542 Cervicalgia: Secondary | ICD-10-CM

## 2024-06-17 DIAGNOSIS — S46812D Strain of other muscles, fascia and tendons at shoulder and upper arm level, left arm, subsequent encounter: Secondary | ICD-10-CM

## 2024-06-17 DIAGNOSIS — M5020 Other cervical disc displacement, unspecified cervical region: Secondary | ICD-10-CM

## 2024-06-17 MED ORDER — DULOXETINE HCL 30 MG PO CPEP
30.0000 mg | ORAL_CAPSULE | Freq: Every day | ORAL | 1 refills | Status: AC
  Filled 2024-06-17: qty 90, 90d supply, fill #0
  Filled 2024-09-09: qty 90, 90d supply, fill #1

## 2024-06-17 MED ORDER — MELOXICAM 15 MG PO TABS
15.0000 mg | ORAL_TABLET | Freq: Every day | ORAL | 0 refills | Status: AC | PRN
  Filled 2024-06-17: qty 30, 30d supply, fill #0

## 2024-06-17 NOTE — Patient Instructions (Signed)
-   Use meloxicam  15 mg daily as needed for breakthrough pain.  Recommend limiting chronic NSAIDs to 1-2 doses per week to prevent long-term side effects. Use Tylenol 500 to 1000 mg tablets 2-3 times a day as needed for day-to-day pain relief.    Meloxicam  refill   Refill Cymbalta  30 90 1  If pain returns identically call and ask for a repeat epidural. If pain is different follow up in clinic   As needed follow up

## 2024-06-17 NOTE — Progress Notes (Signed)
 Joann Case [name] [address] [place] [address] Phone: [phone]   Assessment and Plan:     1. Neck pain (Primary) 2. Chronic left-sided thoracic back pain 3. Cervical disc herniation 4. Strain of left trapezius muscle, subsequent encounter -Chronic with exacerbation, subsequent visit - Overall significant improvement in left-sided neck, trapezius, upper back pain radiating to anterior rib and pectoralis musculature.  Patient's states 85% after epidural CSI performed on 05/28/2024 to left-sided C6-7, with decreased need for pain medication, and improved function - Use meloxicam  15 mg daily as needed for breakthrough pain.  Recommend limiting chronic NSAIDs to 1-2 doses per week to prevent long-term side effects. Use Tylenol 500 to 1000 mg tablets 2-3 times a day as needed for day-to-day pain relief.   Refill provided - Overall improvements with starting Cymbalta  30 mg daily.  Continue Cymbalta  30 mg daily.  Refill provided - Continue HEP for neck    Pertinent previous records reviewed include epidural procedure note   Follow Up: As needed if no improvement or worsening of symptoms.  If symptoms return identically, could order repeat left-sided C6-7 epidural CSI.  If symptoms change, recommend follow-up in clinic for further evaluation   Subjective:   I, Joann Case, am serving as a Neurosurgeon for Doctor Joann Case   Chief Complaint: neck and upper trap pain    HPI:    05/17/2024  Patient is a 28 year old female with neck and upper trap pain. Patient states intermittent pain 8 years. Pain starts in the neck and radiates down the upper trap. Does have MRI. Has done PT. Massages dont help. Pain is getting worse. Decreased ROM. Doe shave anxiety about flares. No MOI. Pain feels like a stabbing shooting pain.    06/17/2024 Patient states she is doing good. CSI helped a lot . Meds have helped as well. Needs a refill    Relevant Historical Information: ADHD  Additional pertinent review of systems negative.   Current Outpatient Medications:    DULoxetine  (CYMBALTA ) 30 MG capsule, Take 1 capsule (30 mg total) by mouth daily., Disp: 90 capsule, Rfl: 1   amphetamine -dextroamphetamine  (ADDERALL) 10 MG tablet, Take 2 tablets (20 mg total) by mouth 2 (two) times daily., Disp: 60 tablet, Rfl: 0   amphetamine -dextroamphetamine  (ADDERALL) 10 MG tablet, Take 2 tablets (20 mg total) by mouth 2 (two) times daily. (08/21/23), Disp: 120 tablet, Rfl: 0   amphetamine -dextroamphetamine  (ADDERALL) 10 MG tablet, Take 2 tablets (20 mg total) by mouth 2 (two) times daily., Disp: 120 tablet, Rfl: 0   amphetamine -dextroamphetamine  (ADDERALL) 10 MG tablet, Take 2 tablets (20 mg total) by mouth 2 (two) times daily., Disp: 120 tablet, Rfl: 0   amphetamine -dextroamphetamine  (ADDERALL) 10 MG tablet, Take 2 tablets (20 mg total) by mouth 2 (two) times daily. (12/05/23), Disp: 120 tablet, Rfl: 0   amphetamine -dextroamphetamine  (ADDERALL) 10 MG tablet, Take 2 tablets (20 mg total) by mouth 2 (two) times daily. (11/06/23), Disp: 120 tablet, Rfl: 0   amphetamine -dextroamphetamine  (ADDERALL) 10 MG tablet, Take 2 tablets (20 mg total) by mouth 2 (two) times daily., Disp: 120 tablet, Rfl: 0   amphetamine -dextroamphetamine  (ADDERALL) 10 MG tablet, Take 2 tablets (20 mg total) by mouth 2 (two) times daily., Disp: 120 tablet, Rfl: 0   amphetamine -dextroamphetamine  (ADDERALL) 10 MG tablet, Take 2 tablets (20 mg total) by mouth 2 (two) times daily., Disp: 120 tablet, Rfl: 0   amphetamine -dextroamphetamine  (ADDERALL) 10 MG tablet, Take 2 tablets (20 mg  total) by mouth 2 (two) times daily., Disp: 120 tablet, Rfl: 0   amphetamine -dextroamphetamine  (ADDERALL) 10 MG tablet, Take 2 tablets (20 mg total) by mouth 2 (two) times daily., Disp: 120 tablet, Rfl: 0   meloxicam  (MOBIC ) 15 MG tablet, Take 1 tablet (15 mg total) by mouth daily as needed for pain., Disp: 30  tablet, Rfl: 0   TRI-SPRINTEC 0.18/0.215/0.25 MG-35 MCG tablet, Take 1 tablet by mouth daily., Disp: , Rfl:    Objective:     Vitals:   06/17/24 0950  BP: 110/76  Pulse: 88  SpO2: 98%  Weight: 136 lb (61.7 kg)  Height: 5' 3 (1.6 m)      Body mass index is 24.09 kg/m.    Physical Exam:    Neck Exam: Cervical Spine- Posture normal Skin- normal, intact  Neuro:  Strength-  Right Left   Deltoid (C5) 5/5 5/5  Bicep/Brachioradialis (C5/6) 5/5  5/5  Wrist Extension (C6) 5/5 5/5  Tricep (C7) 5/5 5/5  Wrist Flexion (C7) 5/5 5/5  Grip (C8) 5/5 5/5  Finger Abduction (T1) 5/5 5/5   Sensation: intact to light touch in upper extremities bilaterally  Spurling's:  negative bilaterally Neck ROM: Full active ROM NTTP: cervical spinous processes, cervical paraspinal, thoracic paraspinal, trapezius    Electronically signed by:  Odis Case D.CLEMENTE[name] 10:13 AM 06/17/24

## 2024-07-08 ENCOUNTER — Other Ambulatory Visit (HOSPITAL_COMMUNITY): Payer: Self-pay

## 2024-07-08 MED ORDER — AMPHETAMINE-DEXTROAMPHETAMINE 10 MG PO TABS
20.0000 mg | ORAL_TABLET | Freq: Two times a day (BID) | ORAL | 0 refills | Status: DC
  Filled 2024-07-08: qty 120, 30d supply, fill #0

## 2024-07-11 ENCOUNTER — Telehealth: Payer: Self-pay | Admitting: Sports Medicine

## 2024-07-11 ENCOUNTER — Other Ambulatory Visit (HOSPITAL_COMMUNITY): Payer: Self-pay

## 2024-07-11 MED ORDER — AMPHETAMINE-DEXTROAMPHETAMINE 10 MG PO TABS
20.0000 mg | ORAL_TABLET | Freq: Two times a day (BID) | ORAL | 0 refills | Status: DC
  Filled 2024-07-11 – 2024-09-09 (×2): qty 120, 30d supply, fill #0

## 2024-07-11 MED ORDER — AMPHETAMINE-DEXTROAMPHETAMINE 10 MG PO TABS
20.0000 mg | ORAL_TABLET | Freq: Two times a day (BID) | ORAL | 0 refills | Status: AC
  Filled 2024-07-11 – 2024-08-06 (×2): qty 120, 30d supply, fill #0

## 2024-07-11 NOTE — Telephone Encounter (Signed)
 Patient called and asked if Dr. Leonce could order a repeat epidural injection. The pain came back the same and the same location. She also stated that her and her husband have started to try to have kids. She does not know if she is pregnant or not but she wants to get it on the books in case she is not but wanted that to be in the back of your head as well. Please advise.

## 2024-07-15 ENCOUNTER — Other Ambulatory Visit: Payer: Self-pay | Admitting: Sports Medicine

## 2024-07-15 DIAGNOSIS — S46812A Strain of other muscles, fascia and tendons at shoulder and upper arm level, left arm, initial encounter: Secondary | ICD-10-CM

## 2024-07-15 DIAGNOSIS — S46812D Strain of other muscles, fascia and tendons at shoulder and upper arm level, left arm, subsequent encounter: Secondary | ICD-10-CM

## 2024-07-15 DIAGNOSIS — M5020 Other cervical disc displacement, unspecified cervical region: Secondary | ICD-10-CM

## 2024-07-15 DIAGNOSIS — G8929 Other chronic pain: Secondary | ICD-10-CM

## 2024-07-15 DIAGNOSIS — M542 Cervicalgia: Secondary | ICD-10-CM

## 2024-07-15 DIAGNOSIS — F411 Generalized anxiety disorder: Secondary | ICD-10-CM

## 2024-07-15 NOTE — Progress Notes (Unsigned)
 left-sided C6-7 epidural CSI

## 2024-07-15 NOTE — Telephone Encounter (Signed)
 Called and left VM

## 2024-07-31 ENCOUNTER — Other Ambulatory Visit

## 2024-08-06 ENCOUNTER — Other Ambulatory Visit (HOSPITAL_COMMUNITY): Payer: Self-pay

## 2024-08-08 ENCOUNTER — Telehealth: Payer: Self-pay | Admitting: Sports Medicine

## 2024-08-08 NOTE — Discharge Instructions (Signed)

## 2024-08-08 NOTE — Telephone Encounter (Signed)
 Per pt, [facility] called and her epidural scheduled for 12/5 has been denied by insurance, more clinical info needed.

## 2024-08-08 NOTE — Telephone Encounter (Signed)
 Sent a message to the people who handle these precerts at [facility] and asking htem if I need to take over and if so to fax me the denial letter.

## 2024-08-08 NOTE — Telephone Encounter (Signed)
 [facility] reached back out to let me know they are handling it. They will reach out to us  if we need to do anything. Closing this telephone encounter

## 2024-08-09 ENCOUNTER — Inpatient Hospital Stay
Admission: RE | Admit: 2024-08-09 | Discharge: 2024-08-09 | Disposition: A | Source: Ambulatory Visit | Attending: Sports Medicine

## 2024-08-09 ENCOUNTER — Other Ambulatory Visit

## 2024-08-09 DIAGNOSIS — S46812A Strain of other muscles, fascia and tendons at shoulder and upper arm level, left arm, initial encounter: Secondary | ICD-10-CM

## 2024-08-09 DIAGNOSIS — S46812D Strain of other muscles, fascia and tendons at shoulder and upper arm level, left arm, subsequent encounter: Secondary | ICD-10-CM

## 2024-08-09 DIAGNOSIS — G8929 Other chronic pain: Secondary | ICD-10-CM

## 2024-08-09 DIAGNOSIS — M5020 Other cervical disc displacement, unspecified cervical region: Secondary | ICD-10-CM

## 2024-08-09 DIAGNOSIS — F411 Generalized anxiety disorder: Secondary | ICD-10-CM

## 2024-08-09 DIAGNOSIS — M542 Cervicalgia: Secondary | ICD-10-CM

## 2024-08-09 MED ORDER — TRIAMCINOLONE ACETONIDE 40 MG/ML IJ SUSP (RADIOLOGY)
60.0000 mg | Freq: Once | INTRAMUSCULAR | Status: AC
  Administered 2024-08-09: 60 mg via EPIDURAL

## 2024-08-09 MED ORDER — IOPAMIDOL (ISOVUE-M 300) INJECTION 61%
1.0000 mL | Freq: Once | INTRAMUSCULAR | Status: AC | PRN
  Administered 2024-08-09: 1 mL via EPIDURAL

## 2024-08-22 ENCOUNTER — Other Ambulatory Visit: Payer: Self-pay

## 2024-08-22 ENCOUNTER — Other Ambulatory Visit (HOSPITAL_COMMUNITY): Payer: Self-pay

## 2024-08-22 MED ORDER — BETAMETHASONE VALERATE 0.1 % EX OINT
1.0000 | TOPICAL_OINTMENT | Freq: Every day | CUTANEOUS | 1 refills | Status: AC
  Filled 2024-08-22: qty 15, 30d supply, fill #0

## 2024-08-22 NOTE — Progress Notes (Unsigned)
 Ben Jackson D.[name] [address] [place] [address] Phone: [phone]   Assessment and Plan:     ***    Pertinent previous records reviewed include ***   Follow Up: ***     Subjective:   I, Joann Case, am serving as a neurosurgeon for Doctor Morene Mace   Chief Complaint: neck and upper trap pain    HPI:    05/17/2024  Patient is a 28 year old female with neck and upper trap pain. Patient states intermittent pain 8 years. Pain starts in the neck and radiates down the upper trap. Does have MRI. Has done PT. Massages dont help. Pain is getting worse. Decreased ROM. Doe shave anxiety about flares. No MOI. Pain feels like a stabbing shooting pain.    06/17/2024 Patient states she is doing good. CSI helped a lot . Meds have helped as well. Needs a refill  08/23/2024 Patient states   Relevant Historical Information: ADHD  Additional pertinent review of systems negative.  Current Medications[1]   Objective:     There were no vitals filed for this visit.    There is no height or weight on file to calculate BMI.    Physical Exam:    ***   Electronically signed by:  Odis Mace D.[name] 7:30 AM 08/22/2024    [1]  Current Outpatient Medications:    amphetamine -dextroamphetamine  (ADDERALL) 10 MG tablet, Take 2 tablets (20 mg total) by mouth 2 (two) times daily., Disp: 60 tablet, Rfl: 0   amphetamine -dextroamphetamine  (ADDERALL) 10 MG tablet, Take 2 tablets (20 mg total) by mouth 2 (two) times daily. (08/21/23), Disp: 120 tablet, Rfl: 0   amphetamine -dextroamphetamine  (ADDERALL) 10 MG tablet, Take 2 tablets (20 mg total) by mouth 2 (two) times daily., Disp: 120 tablet, Rfl: 0   amphetamine -dextroamphetamine  (ADDERALL) 10 MG tablet, Take 2 tablets (20 mg total) by mouth 2 (two) times daily., Disp: 120 tablet, Rfl: 0   amphetamine -dextroamphetamine  (ADDERALL) 10 MG tablet, Take 2 tablets  (20 mg total) by mouth 2 (two) times daily. (12/05/23), Disp: 120 tablet, Rfl: 0   amphetamine -dextroamphetamine  (ADDERALL) 10 MG tablet, Take 2 tablets (20 mg total) by mouth 2 (two) times daily. (11/06/23), Disp: 120 tablet, Rfl: 0   amphetamine -dextroamphetamine  (ADDERALL) 10 MG tablet, Take 2 tablets (20 mg total) by mouth 2 (two) times daily., Disp: 120 tablet, Rfl: 0   amphetamine -dextroamphetamine  (ADDERALL) 10 MG tablet, Take 2 tablets (20 mg total) by mouth 2 (two) times daily., Disp: 120 tablet, Rfl: 0   amphetamine -dextroamphetamine  (ADDERALL) 10 MG tablet, Take 2 tablets (20 mg total) by mouth 2 (two) times daily., Disp: 120 tablet, Rfl: 0   amphetamine -dextroamphetamine  (ADDERALL) 10 MG tablet, Take 2 tablets (20 mg total) by mouth 2 (two) times daily., Disp: 120 tablet, Rfl: 0   amphetamine -dextroamphetamine  (ADDERALL) 10 MG tablet, Take 2 tablets (20 mg total) by mouth 2 (two) times daily., Disp: 120 tablet, Rfl: 0   amphetamine -dextroamphetamine  (ADDERALL) 10 MG tablet, Take 2 tablets (20 mg total) by mouth 2 (two) times daily., Disp: 120 tablet, Rfl: 0   DULoxetine  (CYMBALTA ) 30 MG capsule, Take 1 capsule (30 mg total) by mouth daily., Disp: 90 capsule, Rfl: 1   meloxicam  (MOBIC ) 15 MG tablet, Take 1 tablet (15 mg total) by mouth daily as needed for pain., Disp: 30 tablet, Rfl: 0   TRI-SPRINTEC 0.18/0.215/0.25 MG-35 MCG tablet, Take 1 tablet by mouth daily., Disp: , Rfl:

## 2024-08-23 ENCOUNTER — Ambulatory Visit: Payer: Self-pay | Admitting: Sports Medicine

## 2024-08-23 VITALS — HR 101 | Ht 63.0 in | Wt 138.0 lb

## 2024-08-23 DIAGNOSIS — M546 Pain in thoracic spine: Secondary | ICD-10-CM | POA: Diagnosis not present

## 2024-08-23 DIAGNOSIS — G8929 Other chronic pain: Secondary | ICD-10-CM | POA: Diagnosis not present

## 2024-08-23 DIAGNOSIS — M5020 Other cervical disc displacement, unspecified cervical region: Secondary | ICD-10-CM

## 2024-08-23 DIAGNOSIS — M542 Cervicalgia: Secondary | ICD-10-CM

## 2024-08-23 NOTE — Patient Instructions (Signed)
 As needed follow up   If pain return identically , call and ask for repeat epidural. If pain changes follow up in clinic

## 2024-09-09 ENCOUNTER — Other Ambulatory Visit (HOSPITAL_COMMUNITY): Payer: Self-pay

## 2024-10-01 ENCOUNTER — Other Ambulatory Visit (HOSPITAL_COMMUNITY): Payer: Self-pay

## 2024-10-01 ENCOUNTER — Other Ambulatory Visit: Payer: Self-pay

## 2024-10-01 MED ORDER — MINOCYCLINE HCL 100 MG PO CAPS
100.0000 mg | ORAL_CAPSULE | Freq: Two times a day (BID) | ORAL | 11 refills | Status: AC
  Filled 2024-10-01 (×2): qty 60, 30d supply, fill #0

## 2024-10-09 ENCOUNTER — Other Ambulatory Visit: Payer: Self-pay

## 2024-10-09 ENCOUNTER — Other Ambulatory Visit (HOSPITAL_COMMUNITY): Payer: Self-pay

## 2024-10-10 ENCOUNTER — Other Ambulatory Visit (HOSPITAL_COMMUNITY): Payer: Self-pay

## 2024-10-10 MED ORDER — AMPHETAMINE-DEXTROAMPHETAMINE 10 MG PO TABS
20.0000 mg | ORAL_TABLET | Freq: Two times a day (BID) | ORAL | 0 refills | Status: AC
  Filled 2024-10-10: qty 60, 15d supply, fill #0

## 2024-10-11 ENCOUNTER — Other Ambulatory Visit (HOSPITAL_COMMUNITY): Payer: Self-pay

## 2024-10-11 MED ORDER — AMPHETAMINE-DEXTROAMPHETAMINE 10 MG PO TABS: 20.0000 mg | ORAL_TABLET | Freq: Two times a day (BID) | ORAL | 0 refills | Status: AC

## 2024-10-11 MED ORDER — AMPHETAMINE-DEXTROAMPHETAMINE 10 MG PO TABS
20.0000 mg | ORAL_TABLET | Freq: Two times a day (BID) | ORAL | 0 refills | Status: AC
  Filled 2024-10-11: qty 100, 25d supply, fill #0
  Filled 2024-10-11: qty 120, 30d supply, fill #0
  Filled 2024-10-11: qty 80, 20d supply, fill #0
  Filled 2024-10-11 (×2): qty 40, 10d supply, fill #0

## 2025-03-06 ENCOUNTER — Ambulatory Visit: Admitting: Allergy
